# Patient Record
Sex: Female | Born: 1955 | Race: White | Hispanic: No | Marital: Single | Smoking: Current every day smoker
Health system: Southern US, Community
[De-identification: ages and names within clinical notes are randomized; demographics above are authoritative.]

## PROBLEM LIST (undated history)

## (undated) DIAGNOSIS — M1990 Unspecified osteoarthritis, unspecified site: Secondary | ICD-10-CM

## (undated) DIAGNOSIS — E559 Vitamin D deficiency, unspecified: Secondary | ICD-10-CM

## (undated) DIAGNOSIS — J449 Chronic obstructive pulmonary disease, unspecified: Secondary | ICD-10-CM

## (undated) DIAGNOSIS — I10 Essential (primary) hypertension: Secondary | ICD-10-CM

## (undated) DIAGNOSIS — C801 Malignant (primary) neoplasm, unspecified: Secondary | ICD-10-CM

## (undated) DIAGNOSIS — T7840XA Allergy, unspecified, initial encounter: Secondary | ICD-10-CM

## (undated) HISTORY — DX: Malignant (primary) neoplasm, unspecified: C80.1

## (undated) HISTORY — DX: Vitamin D deficiency, unspecified: E55.9

## (undated) HISTORY — DX: Unspecified osteoarthritis, unspecified site: M19.90

## (undated) HISTORY — PX: ABDOMINAL HYSTERECTOMY: SHX81

## (undated) HISTORY — DX: Allergy, unspecified, initial encounter: T78.40XA

---

## 1999-08-18 ENCOUNTER — Encounter: Payer: Self-pay | Admitting: Family Medicine

## 1999-08-18 ENCOUNTER — Ambulatory Visit (HOSPITAL_COMMUNITY): Admission: RE | Admit: 1999-08-18 | Discharge: 1999-08-18 | Payer: Self-pay | Admitting: Family Medicine

## 2000-09-21 ENCOUNTER — Emergency Department (HOSPITAL_COMMUNITY): Admission: EM | Admit: 2000-09-21 | Discharge: 2000-09-21 | Payer: Self-pay | Admitting: Emergency Medicine

## 2000-09-21 ENCOUNTER — Encounter: Payer: Self-pay | Admitting: Emergency Medicine

## 2000-10-24 ENCOUNTER — Ambulatory Visit (HOSPITAL_BASED_OUTPATIENT_CLINIC_OR_DEPARTMENT_OTHER): Admission: RE | Admit: 2000-10-24 | Discharge: 2000-10-24 | Payer: Self-pay

## 2000-11-07 ENCOUNTER — Encounter: Admission: RE | Admit: 2000-11-07 | Discharge: 2001-01-10 | Payer: Self-pay | Admitting: Family Medicine

## 2004-11-28 ENCOUNTER — Emergency Department (HOSPITAL_COMMUNITY): Admission: EM | Admit: 2004-11-28 | Discharge: 2004-11-28 | Payer: Self-pay | Admitting: Emergency Medicine

## 2004-12-09 ENCOUNTER — Emergency Department (HOSPITAL_COMMUNITY): Admission: EM | Admit: 2004-12-09 | Discharge: 2004-12-09 | Payer: Self-pay | Admitting: Family Medicine

## 2009-03-22 ENCOUNTER — Emergency Department (HOSPITAL_COMMUNITY): Admission: EM | Admit: 2009-03-22 | Discharge: 2009-03-22 | Payer: Self-pay | Admitting: Emergency Medicine

## 2009-03-25 ENCOUNTER — Emergency Department (HOSPITAL_COMMUNITY): Admission: EM | Admit: 2009-03-25 | Discharge: 2009-03-25 | Payer: Self-pay | Admitting: Emergency Medicine

## 2009-03-26 ENCOUNTER — Ambulatory Visit (HOSPITAL_COMMUNITY): Admission: RE | Admit: 2009-03-26 | Discharge: 2009-03-26 | Payer: Self-pay | Admitting: Emergency Medicine

## 2009-04-03 ENCOUNTER — Emergency Department (HOSPITAL_COMMUNITY): Admission: EM | Admit: 2009-04-03 | Discharge: 2009-04-03 | Payer: Self-pay | Admitting: Emergency Medicine

## 2009-08-05 ENCOUNTER — Encounter: Admission: RE | Admit: 2009-08-05 | Discharge: 2009-09-24 | Payer: Self-pay

## 2009-10-01 ENCOUNTER — Emergency Department (HOSPITAL_COMMUNITY): Admission: EM | Admit: 2009-10-01 | Discharge: 2009-10-01 | Payer: Self-pay | Admitting: Family Medicine

## 2010-03-02 ENCOUNTER — Emergency Department (HOSPITAL_COMMUNITY): Admission: EM | Admit: 2010-03-02 | Discharge: 2010-03-02 | Payer: Self-pay | Admitting: Family Medicine

## 2010-03-12 ENCOUNTER — Emergency Department (HOSPITAL_COMMUNITY): Admission: EM | Admit: 2010-03-12 | Discharge: 2010-03-12 | Payer: Self-pay | Admitting: Emergency Medicine

## 2010-09-04 ENCOUNTER — Emergency Department (HOSPITAL_COMMUNITY): Admission: EM | Admit: 2010-09-04 | Discharge: 2010-09-04 | Payer: Self-pay | Admitting: Family Medicine

## 2011-12-08 ENCOUNTER — Emergency Department (INDEPENDENT_AMBULATORY_CARE_PROVIDER_SITE_OTHER)
Admission: EM | Admit: 2011-12-08 | Discharge: 2011-12-08 | Disposition: A | Discharge status: Home / Self Care | Payer: Self-pay | Source: Home / Self Care | Attending: Family Medicine | Admitting: Family Medicine

## 2011-12-08 DIAGNOSIS — J40 Bronchitis, not specified as acute or chronic: Secondary | ICD-10-CM

## 2011-12-08 MED ORDER — GUAIFENESIN-CODEINE 100-10 MG/5ML PO SYRP: 5.0000 mL | ORAL_SOLUTION | Freq: Four times a day (QID) | ORAL | Status: AC | PRN

## 2011-12-08 MED ORDER — AZITHROMYCIN 250 MG PO TABS: 250.0000 mg | ORAL_TABLET | Freq: Every day | ORAL | Status: AC

## 2011-12-08 NOTE — ED Provider Notes (Signed)
History     CSN: [id number]  Arrival date & time 12/08/11  1225   First MD Initiated Contact with Patient 12/08/11 1358      Chief Complaint  Patient presents with  . Cough  . URI    (Consider location/radiation/quality/duration/timing/severity/associated sxs/prior treatment) HPI Comments: The patient reports having a productive cough that is causing her chest and abd to hurt. She states it started on the 23 of dec. Had fever initially that has resolved. Reports having a sore throat with some right ear discomfort. States she was improving but now symptoms seem to be worsening. She is taking otc meds with minimal relief.   Patient is a 56 y.o. female presenting with URI. The history is provided by the patient.  URI The primary symptoms include fever, ear pain, sore throat and cough.  Symptoms associated with the illness include chills, congestion and rhinorrhea.    History reviewed. No pertinent past medical history.  History reviewed. No pertinent past surgical history.  No family history on file.  History  Substance Use Topics  . Smoking status: Current Everyday Smoker -- 0.5 packs/day    Types: Cigarettes  . Smokeless tobacco: Not on file  . Alcohol Use: No    OB History    Grav Para Term Preterm Abortions TAB SAB Ect Mult Living                  Review of Systems  Constitutional: Positive for fever and chills.  HENT: Positive for ear pain, congestion, sore throat, rhinorrhea and postnasal drip. Negative for neck stiffness and ear discharge.   Respiratory: Positive for cough.   Cardiovascular: Negative.   Gastrointestinal: Negative.   Genitourinary: Negative.   Musculoskeletal: Negative.   Skin: Negative.     Allergies  Review of patient's allergies indicates no known allergies.  Home Medications   Current Outpatient Rx  Name Route Sig Dispense Refill  . AZITHROMYCIN 250 MG PO TABS Oral Take 1 tablet (250 mg total) by mouth daily. Take first 2 tablets  together, then 1 every day until finished. 6 tablet 0  . GUAIFENESIN-CODEINE 100-10 MG/5ML PO SYRP Oral Take 5 mLs by mouth every 6 (six) hours as needed for cough. 120 mL 0    BP 158/77  Pulse 80  Temp(Src) 98.4 F (36.9 C) (Oral)  Resp 18  SpO2 98%  Physical Exam  Nursing note and vitals reviewed. Constitutional: She appears well-developed and well-nourished. No distress.  HENT:  Head: Normocephalic.  Nose: Nose normal.  Mouth/Throat: Oropharynx is clear and moist.       Nasal congestion  Neck: Normal range of motion. Neck supple.  Cardiovascular: Normal rate and regular rhythm.   Pulmonary/Chest: Effort normal and breath sounds normal.       Congested cough  Lymphadenopathy:    She has no cervical adenopathy.  Skin: Skin is warm and dry.    ED Course  Procedures (including critical care time)  Labs Reviewed - No data to display No results found.   1. Bronchitis       MDM          Randa Spike, MD 12/08/11 [phone]

## 2011-12-08 NOTE — ED Notes (Signed)
C/o productive cough of yellow sputum, chills, sorethroat, rt ear pain, post nasal drainage and chest hurts with coughing.  Sx started 11/27/11 and are getting worse.

## 2014-04-18 ENCOUNTER — Other Ambulatory Visit (HOSPITAL_COMMUNITY): Payer: Self-pay

## 2014-04-18 DIAGNOSIS — R0602 Shortness of breath: Secondary | ICD-10-CM

## 2014-04-23 ENCOUNTER — Ambulatory Visit (HOSPITAL_COMMUNITY)
Admission: RE | Admit: 2014-04-23 | Discharge: 2014-04-23 | Disposition: A | Discharge status: Home / Self Care | Payer: Managed Care, Other (non HMO) | Source: Ambulatory Visit | Attending: Internal Medicine | Admitting: Internal Medicine

## 2014-04-23 DIAGNOSIS — R0602 Shortness of breath: Secondary | ICD-10-CM | POA: Insufficient documentation

## 2014-04-23 MED ORDER — ALBUTEROL SULFATE (2.5 MG/3ML) 0.083% IN NEBU
2.5000 mg | INHALATION_SOLUTION | Freq: Once | RESPIRATORY_TRACT | Status: AC
  Administered 2014-04-23: 2.5 mg via RESPIRATORY_TRACT

## 2014-04-24 LAB — PULMONARY FUNCTION TEST
DL/VA % pred: 116 %
DL/VA: 5.31 ml/min/mmHg/L
DLCO unc % pred: 86 %
DLCO unc: 18.57 ml/min/mmHg
FEF 25-75 PRE: 0.72 L/s
FEF 25-75 Post: 0.69 L/sec
FEF2575-%CHANGE-POST: -3 %
FEF2575-%Pred-Post: 30 %
FEF2575-%Pred-Pre: 31 %
FEV1-%CHANGE-POST: -1 %
FEV1-%PRED-POST: 56 %
FEV1-%PRED-PRE: 57 %
FEV1-Post: 1.36 L
FEV1-Pre: 1.39 L
FEV1FVC-%CHANGE-POST: -5 %
FEV1FVC-%Pred-Pre: 79 %
FEV6-%Change-Post: 0 %
FEV6-%PRED-POST: 74 %
FEV6-%Pred-Pre: 73 %
FEV6-PRE: 2.19 L
FEV6-Post: 2.21 L
FEV6FVC-%Change-Post: 0 %
FEV6FVC-%PRED-PRE: 102 %
FEV6FVC-%Pred-Post: 102 %
FVC-%Change-Post: 3 %
FVC-%Pred-Post: 74 %
FVC-%Pred-Pre: 72 %
FVC-POST: 2.31 L
FVC-Pre: 2.23 L
POST FEV1/FVC RATIO: 59 %
POST FEV6/FVC RATIO: 99 %
PRE FEV6/FVC RATIO: 99 %
Pre FEV1/FVC ratio: 62 %
RV % PRED: 106 %
RV: 1.99 L
TLC % PRED: 91 %
TLC: 4.35 L

## 2015-09-02 ENCOUNTER — Encounter: Payer: Self-pay | Admitting: Gastroenterology

## 2015-09-29 ENCOUNTER — Encounter: Payer: Managed Care, Other (non HMO) | Admitting: Gastroenterology

## 2015-10-19 ENCOUNTER — Other Ambulatory Visit: Payer: Self-pay | Admitting: Family Medicine

## 2015-10-19 ENCOUNTER — Ambulatory Visit
Admission: RE | Admit: 2015-10-19 | Discharge: 2015-10-19 | Disposition: A | Discharge status: Home / Self Care | Payer: No Typology Code available for payment source | Source: Ambulatory Visit | Attending: Family Medicine | Admitting: Family Medicine

## 2015-10-19 DIAGNOSIS — IMO0001 Reserved for inherently not codable concepts without codable children: Secondary | ICD-10-CM

## 2015-11-10 ENCOUNTER — Encounter: Payer: Self-pay | Admitting: Internal Medicine

## 2017-07-05 ENCOUNTER — Encounter (HOSPITAL_COMMUNITY): Payer: Self-pay | Admitting: Emergency Medicine

## 2017-07-05 ENCOUNTER — Ambulatory Visit (HOSPITAL_COMMUNITY)
Admission: EM | Admit: 2017-07-05 | Discharge: 2017-07-05 | Disposition: A | Discharge status: Home / Self Care | Payer: No Typology Code available for payment source | Attending: Family Medicine | Admitting: Family Medicine

## 2017-07-05 DIAGNOSIS — R05 Cough: Secondary | ICD-10-CM

## 2017-07-05 DIAGNOSIS — R0602 Shortness of breath: Secondary | ICD-10-CM

## 2017-07-05 DIAGNOSIS — J441 Chronic obstructive pulmonary disease with (acute) exacerbation: Secondary | ICD-10-CM

## 2017-07-05 HISTORY — DX: Essential (primary) hypertension: I10

## 2017-07-05 HISTORY — DX: Chronic obstructive pulmonary disease, unspecified: J44.9

## 2017-07-05 MED ORDER — DOXYCYCLINE HYCLATE 100 MG PO TABS: 100.0000 mg | ORAL_TABLET | Freq: Two times a day (BID) | ORAL | 0 refills | Status: DC

## 2017-07-05 MED ORDER — PREDNISONE 50 MG PO TABS: 50.0000 mg | ORAL_TABLET | Freq: Every day | ORAL | 0 refills | Status: DC

## 2017-07-05 NOTE — Discharge Instructions (Signed)
Please take doxycycline and prednisone as prescribed. Please follow-up with your PCP in the next week to ensure you are having resolution of her cough and to consider next steps. Her blood pressure. Please make sure to take your losartan today when you go home, I would take 2 pills tonight.

## 2017-07-05 NOTE — ED Triage Notes (Signed)
The patient presented to the [facility] with a complaint of cough x 2 weeks. The patient also complained of bilateral ear congestion.

## 2017-07-05 NOTE — ED Provider Notes (Signed)
CSN: [id number]     Arrival date & time 07/05/17  1523 History   None    Chief Complaint  Patient presents with  . Cough   (Consider location/radiation/quality/duration/timing/severity/associated sxs/prior Treatment) HPI   Patient with hx of COPD. Patietnt has been SOB. Worsening cough over two weeks.  Patient has been coughing up thick yellow sputum. Has felt a bit warm, though has not checked for fevers. States she has had nasal and ear congestion. No hx of sore throat. Patient states she has had previously high blood pressure. Last week had some headache, took ibuprofen and this resolved.   Past Medical History:  Diagnosis Date  . COPD (chronic obstructive pulmonary disease) ([place])   . Hypertension    History reviewed. No pertinent surgical history. History reviewed. No pertinent family history. Social History  Substance Use Topics  . Smoking status: Current Every Day Smoker    Packs/day: 0.50    Types: Cigarettes  . Smokeless tobacco: Never Used  . Alcohol use No   OB History    No data available     Review of Systems  Constitutional: Negative for chills and fever.  HENT: Positive for congestion. Negative for sore throat.   Eyes: Negative for pain and discharge.  Respiratory: Positive for cough. Negative for shortness of breath and wheezing.   Gastrointestinal: Negative for nausea and vomiting.  Skin: Negative for rash.  Neurological: Negative for dizziness, weakness and headaches.    Allergies  Patient has no known allergies.  Home Medications   Prior to Admission medications   Medication Sig Start Date End Date Taking? Authorizing Provider  losartan (COZAAR) 25 MG tablet Take 25 mg by mouth daily.   Yes [provider]  doxycycline (VIBRA-TABS) 100 MG tablet Take 1 tablet (100 mg total) by mouth 2 (two) times daily. 07/05/17   Taccara Bushnell, Jeani Sow, MD  predniSONE (DELTASONE) 50 MG tablet Take 1 tablet (50 mg total) by mouth daily with breakfast. 07/05/17    Arend Bahl, Jeani Sow, MD   Meds Ordered and Administered this Visit  Medications - No data to display  BP (!) 209/117 (BP Location: Right Arm) Comment: 2 times. Has not taken HTN medication today  Pulse (!) 57   Temp 98.6 F (37 C) (Oral)   Resp 18   SpO2 97%  No data found.   Physical Exam  Constitutional: She is oriented to person, place, and time. She appears well-developed and well-nourished.  HENT:  Head: Normocephalic and atraumatic.  Right Ear: External ear normal.  Left Ear: External ear normal.  Noted to have some post-nasal drip   Eyes: Pupils are equal, round, and reactive to light. Conjunctivae are normal.  Neck: Neck supple.  Cardiovascular: Normal rate.   Pulmonary/Chest: Effort normal. No respiratory distress. She has no wheezes.  Abdominal: Soft. Bowel sounds are normal.  Musculoskeletal: Normal range of motion.  Neurological: She is alert and oriented to person, place, and time.  Skin: Skin is warm and dry.  Psychiatric: She has a normal mood and affect.    Urgent Care Course     Procedures (including critical care time)  Labs Review Labs Reviewed - No data to display  Imaging Review No results found.      MDM   1. COPD exacerbation (HCC)    History of COPD with 2 weeks of cough, congestion, increased sputum production, dyspnea Will treat for COPD exacerbation noted some postnasal drip otherwise overall normal physical exam. Nares have elevated blood pressure  during this visit has not taken her blood pressure medication today-no headache, blurry vision. Patient plans to take blood pressure medication when she goes home. Will follow-up in one week with PCP  Meds ordered this encounter  Medications  . losartan (COZAAR) 25 MG tablet    Sig: Take 25 mg by mouth daily.  Marland Kitchen doxycycline (VIBRA-TABS) 100 MG tablet    Sig: Take 1 tablet (100 mg total) by mouth 2 (two) times daily.    Dispense:  14 tablet    Refill:  0  . predniSONE (DELTASONE) 50  MG tablet    Sig: Take 1 tablet (50 mg total) by mouth daily with breakfast.    Dispense:  5 tablet    Refill:  0       Tonette Bihari, MD 07/05/17 [phone]

## 2017-07-14 ENCOUNTER — Other Ambulatory Visit: Payer: Self-pay

## 2017-07-14 ENCOUNTER — Emergency Department (HOSPITAL_COMMUNITY): Payer: Self-pay

## 2017-07-14 ENCOUNTER — Encounter (HOSPITAL_COMMUNITY): Payer: Self-pay | Admitting: *Deleted

## 2017-07-14 ENCOUNTER — Emergency Department (HOSPITAL_COMMUNITY)
Admission: EM | Admit: 2017-07-14 | Discharge: 2017-07-14 | Disposition: A | Discharge status: Home / Self Care | Payer: Self-pay | Attending: Emergency Medicine | Admitting: Emergency Medicine

## 2017-07-14 DIAGNOSIS — J449 Chronic obstructive pulmonary disease, unspecified: Secondary | ICD-10-CM | POA: Insufficient documentation

## 2017-07-14 DIAGNOSIS — I10 Essential (primary) hypertension: Secondary | ICD-10-CM | POA: Insufficient documentation

## 2017-07-14 DIAGNOSIS — F17210 Nicotine dependence, cigarettes, uncomplicated: Secondary | ICD-10-CM | POA: Insufficient documentation

## 2017-07-14 DIAGNOSIS — R410 Disorientation, unspecified: Secondary | ICD-10-CM | POA: Insufficient documentation

## 2017-07-14 LAB — CBG MONITORING, ED: GLUCOSE-CAPILLARY: 90 mg/dL (ref 65–99)

## 2017-07-14 LAB — RAPID URINE DRUG SCREEN, HOSP PERFORMED
Amphetamines: POSITIVE — AB
Barbiturates: NOT DETECTED
Benzodiazepines: NOT DETECTED
Cocaine: NOT DETECTED
OPIATES: NOT DETECTED
Tetrahydrocannabinol: NOT DETECTED

## 2017-07-14 LAB — COMPREHENSIVE METABOLIC PANEL
ALBUMIN: 4.1 g/dL (ref 3.5–5.0)
ALT: 20 U/L (ref 14–54)
AST: 27 U/L (ref 15–41)
Alkaline Phosphatase: 64 U/L (ref 38–126)
Anion gap: 8 (ref 5–15)
BUN: 15 mg/dL (ref 6–20)
CALCIUM: 9.9 mg/dL (ref 8.9–10.3)
CO2: 24 mmol/L (ref 22–32)
CREATININE: 0.68 mg/dL (ref 0.44–1.00)
Chloride: 108 mmol/L (ref 101–111)
Glucose, Bld: 125 mg/dL — ABNORMAL HIGH (ref 65–99)
Potassium: 4.8 mmol/L (ref 3.5–5.1)
SODIUM: 140 mmol/L (ref 135–145)
Total Bilirubin: 0.6 mg/dL (ref 0.3–1.2)
Total Protein: 7.1 g/dL (ref 6.5–8.1)

## 2017-07-14 LAB — URINALYSIS, ROUTINE W REFLEX MICROSCOPIC
Bilirubin Urine: NEGATIVE
Glucose, UA: NEGATIVE mg/dL
Hgb urine dipstick: NEGATIVE
KETONES UR: NEGATIVE mg/dL
LEUKOCYTES UA: NEGATIVE
NITRITE: NEGATIVE
PROTEIN: NEGATIVE mg/dL
RBC / HPF: NONE SEEN RBC/hpf (ref 0–5)
Specific Gravity, Urine: 1.014 (ref 1.005–1.030)
pH: 8 (ref 5.0–8.0)

## 2017-07-14 LAB — CBC
HCT: 45.9 % (ref 36.0–46.0)
Hemoglobin: 14.9 g/dL (ref 12.0–15.0)
MCH: 27.7 pg (ref 26.0–34.0)
MCHC: 32.5 g/dL (ref 30.0–36.0)
MCV: 85.3 fL (ref 78.0–100.0)
PLATELETS: 328 10*3/uL (ref 150–400)
RBC: 5.38 MIL/uL — ABNORMAL HIGH (ref 3.87–5.11)
RDW: 13.8 % (ref 11.5–15.5)
WBC: 6.3 10*3/uL (ref 4.0–10.5)

## 2017-07-14 LAB — TSH: TSH: 1.014 u[IU]/mL (ref 0.350–4.500)

## 2017-07-14 NOTE — ED Notes (Signed)
Pt departed in NAD, refused use of wheelchair.  

## 2017-07-14 NOTE — ED Provider Notes (Signed)
[facility] DEPT Provider Note   CSN: [id number] Arrival date & time: 07/14/17  1503     History   Chief Complaint Chief Complaint  Patient presents with  . Altered Mental Status    HPI Maria Chase is a 61 y.o. female. With history of COPD, HTN,  Who presents with altered mental status. Husband and daughter at bedside, providing history. They report last known normal was 11 PM. She awoke in the middle the night thinking someone was coming in the door, however no one was there. This morning after showering, she developed diffuse body shaking, vomiting, and seemed to be confused. Patient thought she saw snails in her vomit. She has made comments regarding snails in her intestines for the past month. Her confusion seems new to her family members.  She was recently treated for COPD exacerbation with antibiotics and prednisone, which she has completed. She is still had some lingering cough, nonproductive. She denies any recent fevers, chills, chest pain or shortness of breath, abdominal pain, or urinary symptoms. She denies any recent falls or trauma. She denies any recent ingestions.  HPI  Past Medical History:  Diagnosis Date  . COPD (chronic obstructive pulmonary disease) ([place])   . Hypertension     There are no active problems to display for this patient.   History reviewed. No pertinent surgical history.  OB History    No data available       Home Medications    Prior to Admission medications   Medication Sig Start Date End Date Taking? Authorizing Provider  albuterol (PROVENTIL) (5 MG/ML) 0.5% nebulizer solution Take 0.5 mLs by nebulization as needed for wheezing.   Yes [provider]  alendronate (FOSAMAX) 70 MG tablet Take 70 mg by mouth once a week. 06/20/17  Yes [provider]  ibuprofen (ADVIL,MOTRIN) 200 MG tablet Take 800 mg by mouth every 6 (six) hours as needed for mild pain.   Yes [provider]  losartan (COZAAR) 25 MG tablet  Take 25 mg by mouth daily.   Yes [provider]  Vitamin D, Ergocalciferol, (DRISDOL) 50000 units CAPS capsule Take 50,000 Units by mouth once a week. 05/19/17  Yes [provider]  doxycycline (VIBRA-TABS) 100 MG tablet Take 1 tablet (100 mg total) by mouth 2 (two) times daily. Patient not taking: Reported on 07/14/2017 07/05/17   Tonette Bihari, MD  predniSONE (DELTASONE) 50 MG tablet Take 1 tablet (50 mg total) by mouth daily with breakfast. Patient not taking: Reported on 07/14/2017 07/05/17   Tonette Bihari, MD    Family History No family history on file.  Social History Social History  Substance Use Topics  . Smoking status: Current Every Day Smoker    Packs/day: 0.50    Types: Cigarettes  . Smokeless tobacco: Never Used  . Alcohol use No     Allergies   Patient has no known allergies.   Review of Systems Review of Systems  Constitutional: Negative for chills and fever.  HENT: Negative for ear pain and sore throat.   Eyes: Negative for pain and visual disturbance.  Respiratory: Positive for cough. Negative for shortness of breath.   Cardiovascular: Negative for chest pain and palpitations.  Gastrointestinal: Positive for vomiting. Negative for abdominal pain.  Genitourinary: Negative for dysuria and hematuria.  Musculoskeletal: Negative for arthralgias and back pain.  Skin: Negative for color change and rash.  Neurological: Negative for seizures and syncope.  Psychiatric/Behavioral: Positive for confusion.  All other systems  reviewed and are negative.    Physical Exam Updated Vital Signs BP (!) 172/96   Pulse (!) 121   Temp 98 F (36.7 C) (Oral)   Resp (!) 24   Ht 5\' 3"  (1.6 m)   Wt 61.2 kg (135 lb)   SpO2 96%   BMI 23.91 kg/m   Physical Exam  Constitutional: She appears well-developed and well-nourished. No distress.  HENT:  Head: Normocephalic and atraumatic.  Eyes: Conjunctivae are normal.  Neck: Neck supple.    Cardiovascular: Normal rate and regular rhythm.   No murmur heard. Pulmonary/Chest: Effort normal and breath sounds normal. No respiratory distress.  Abdominal: Soft. There is no tenderness.  Musculoskeletal: She exhibits no edema.  Neurological: She is alert.  Skin: Skin is warm and dry.  Psychiatric: She has a normal mood and affect.  Nursing note and vitals reviewed.    ED Treatments / Results  Labs (all labs ordered are listed, but only abnormal results are displayed) Labs Reviewed  COMPREHENSIVE METABOLIC PANEL - Abnormal; Notable for the following:       Result Value   Glucose, Bld 125 (*)    All other components within normal limits  CBC - Abnormal; Notable for the following:    RBC 5.38 (*)    All other components within normal limits  URINALYSIS, ROUTINE W REFLEX MICROSCOPIC - Abnormal; Notable for the following:    APPearance TURBID (*)    Bacteria, UA RARE (*)    Squamous Epithelial / LPF 0-5 (*)    All other components within normal limits  RAPID URINE DRUG SCREEN, HOSP PERFORMED - Abnormal; Notable for the following:    Amphetamines POSITIVE (*)    All other components within normal limits  TSH  CBG MONITORING, ED    EKG  EKG Interpretation  Date/Time:  Friday July 14 2017 15:40:13 EDT Ventricular Rate:  97 PR Interval:  148 QRS Duration: 128 QT Interval:  384 QTC Calculation: 487 R Axis:   75 Text Interpretation:  Normal sinus rhythm Biatrial enlargement Right bundle branch block Confirmed by Lajean Saver [phone]) on 07/14/2017 5:44:44 PM       Radiology Dg Chest 2 View  Result Date: 07/14/2017 CLINICAL DATA:  Productive cough with mucus for 2 weeks off and on, history COPD, hypertension, smoking EXAM: CHEST  2 VIEW COMPARISON:  10/19/2015 FINDINGS: Mild enlargement of cardiac silhouette. Mediastinal contours and pulmonary vascularity normal. Emphysematous and bronchitic changes consistent with COPD. No acute infiltrate, pleural effusion, or  pneumothorax. Bones demineralized. IMPRESSION: COPD changes without acute infiltrate. Mild enlargement of cardiac silhouette. Electronically Signed   By: Lavonia Dana M.D.   On: 07/14/2017 18:24   Ct Head Wo Contrast  Result Date: 07/14/2017 CLINICAL DATA:  Altered mental status and headache EXAM: CT HEAD WITHOUT CONTRAST TECHNIQUE: Contiguous axial images were obtained from the base of the skull through the vertex without intravenous contrast. COMPARISON:  None FINDINGS: Brain: No mass lesion, intraparenchymal hemorrhage or extra-axial collection. No evidence of acute cortical infarct. There is periventricular hypoattenuation compatible with chronic microvascular disease. Vascular: No hyperdense vessel or unexpected calcification. Skull: Normal visualized skull base, calvarium and extracranial soft tissues. Sinuses/Orbits: No sinus fluid levels or advanced mucosal thickening. No mastoid effusion. Normal orbits. IMPRESSION: Chronic microvascular ischemia without acute abnormality. Electronically Signed   By: Ulyses Jarred M.D.   On: 07/14/2017 17:22    Procedures Procedures (including critical care time)  Medications Ordered in ED Medications - No data to display  Initial Impression / Assessment and Plan / ED Course  I have reviewed the triage vital signs and the nursing notes.  Pertinent labs & imaging results that were available during my care of the patient were reviewed by me and considered in my medical decision making (see chart for details).     Patient is a 61 year old female who presents with confusion and thoughts perseverating over abdominal infestation with snails. Patient arrived hypertensive, although had not taken her blood pressure medication today. She was otherwise hemodynamically stable. Tearful on exam. She was redirectable on exam, appropriate. Exam otherwise as above.   Labs and imaging obtained, no acute abnormalities. Positive for amphetamines on UDS, however patient  denies any drug usage. Given month long symptoms and history of some anxiety/family stressors, there may be a psychiatric component to patient's symptoms today. Provided reassurance on labs and negative head CT. I discussed close follow-up with her primary care physician. Resource guide in the area for outpatient psychiatric follow-up provided. Husband feels safe taking patient home. Patient and her husband in agreement with plan at time of discharge.  Patient and plan of care discussed with Attending physician, Dr. Ashok Cordia.    Final Clinical Impressions(s) / ED Diagnoses   Final diagnoses:  Confusion    New Prescriptions Discharge Medication List as of 07/14/2017  7:28 PM       Arnetha Massy, MD 07/15/17 Leandrew Koyanagi    Lajean Saver, MD 07/15/17 1513

## 2017-07-14 NOTE — ED Notes (Signed)
Returned from xray

## 2017-07-14 NOTE — Discharge Instructions (Signed)
Your work up was reassuring at this time. Please follow up with PCM in next week. Return to ED with any new or concerning symptoms.

## 2017-07-14 NOTE — ED Triage Notes (Signed)
PT had some cough and coughed it up and she was convinced there were snails in it, but nothing in it.  Pt was given abx and taking it. Family states patient has been having confusion.  She thinks she has snails in her stomach and ears.  Pt went to take a shower and felt her stomach swelled up and the snails were in there and she could not catch her breath.  Pt states she was vomiting and seeing snails in there.  Husband could not understand her. They state this happens intermittently.

## 2017-07-14 NOTE — ED Notes (Signed)
Pt transported to XR.  

## 2017-07-14 NOTE — ED Notes (Signed)
Patient transported to CT 

## 2018-11-30 IMAGING — CT CT HEAD W/O CM
4 series · 16 of 47 positions shown, 18 images · non-contrast
Comparison: None

CLINICAL DATA: Altered mental status and headache

EXAM:
CT HEAD WITHOUT CONTRAST
TECHNIQUE: Contiguous axial images were obtained from the base of the skull
through the vertex without intravenous contrast.

[Series 3: head wo · axial · 0.41mm/px · z∈[-138,-22]mm · 6 of 33 slices shown, 8 images]
[im 5/33  brain]
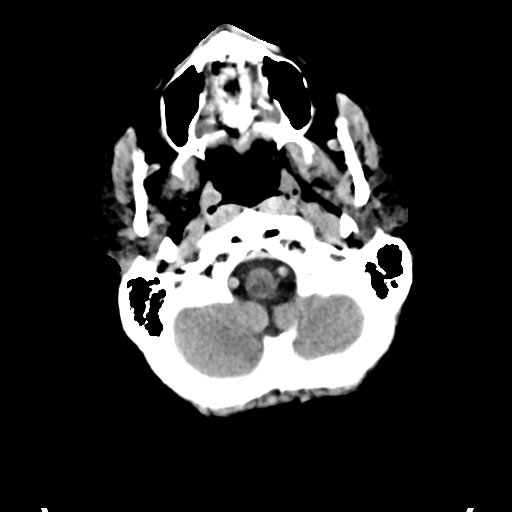
[im 5/33  bone]
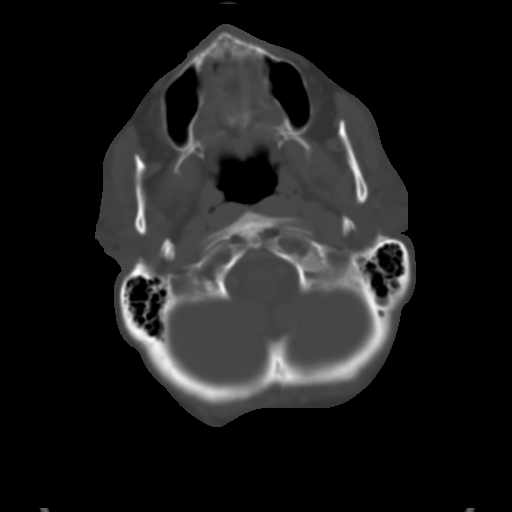
[im 10/33  brain]
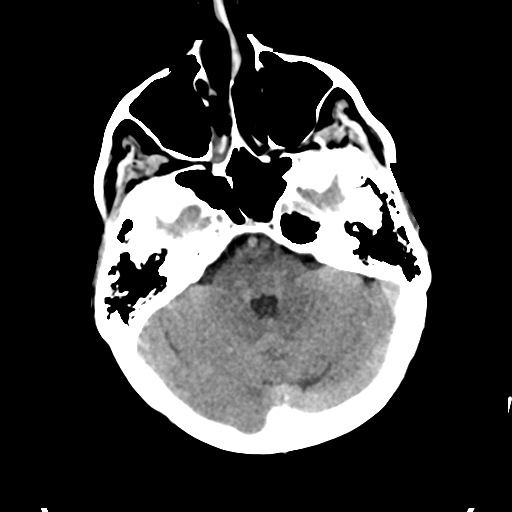
[im 14/33  brain]
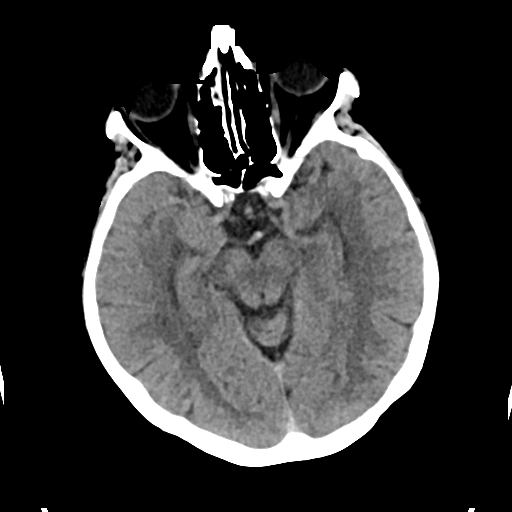
[im 19/33  brain]
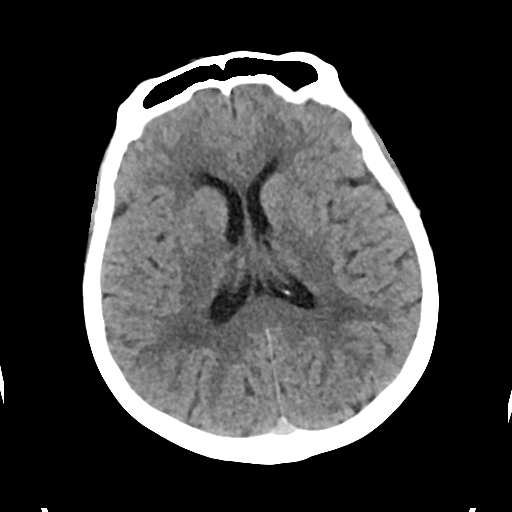
[im 23/33  brain]
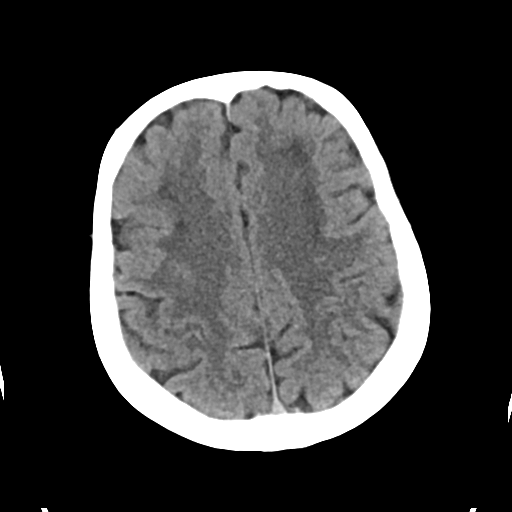
[im 23/33  bone]
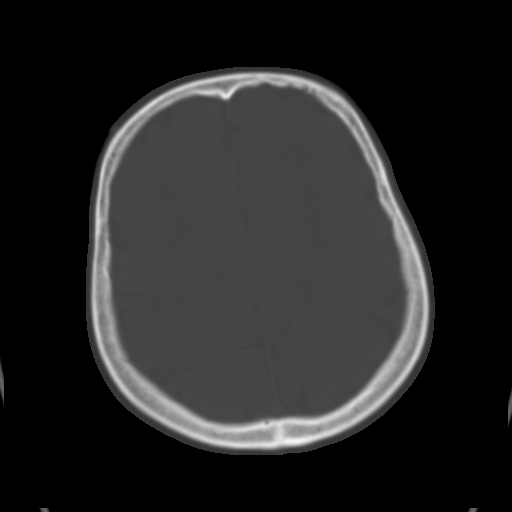
[im 28/33  brain]
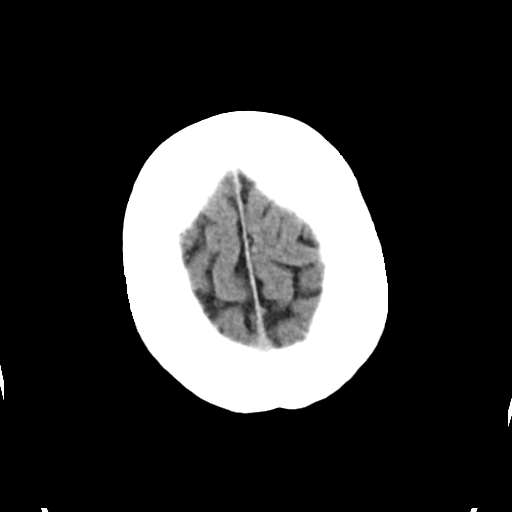

[Series 4: head bone · axial · 0.41mm/px · z∈[-142,-86]mm · 4 of 85 slices shown]
[im 9/85  bone]
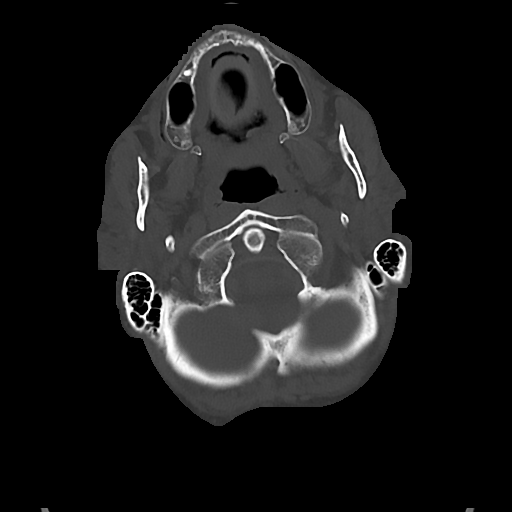
[im 17/85  bone]
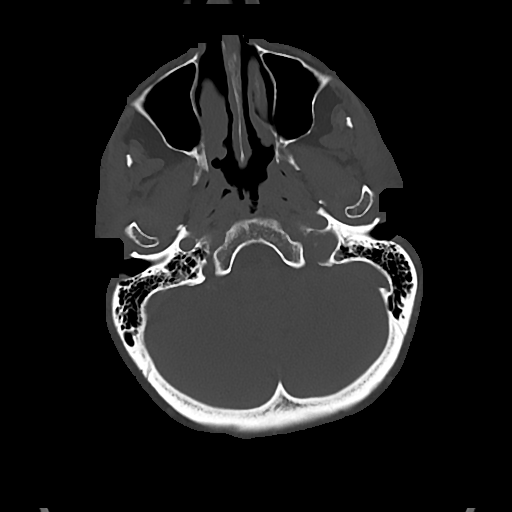
[im 29/85  bone]
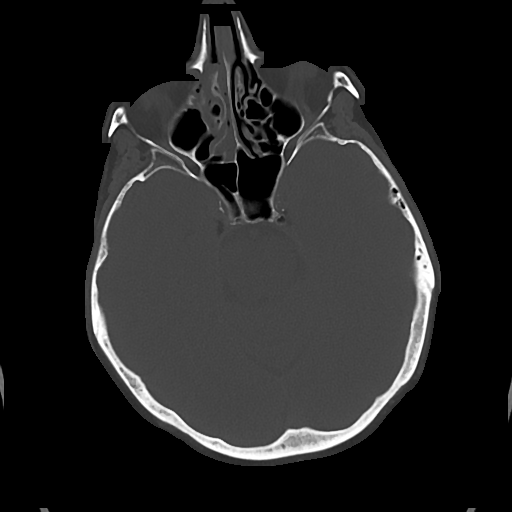
[im 37/85  bone]
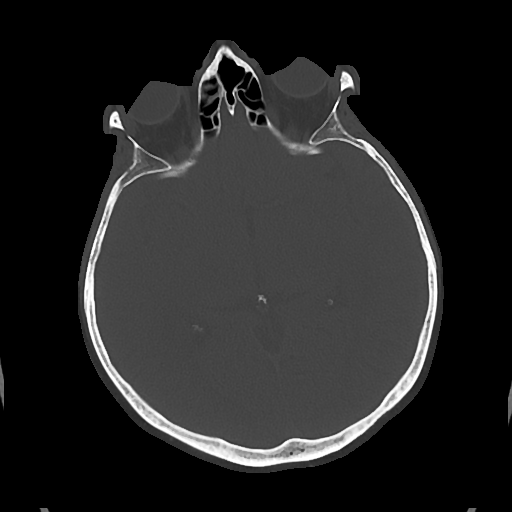

[Series 5: cor soft · coronal · 0.33mm/px · 3 of 72 slices shown]
[im 24/72  brain]
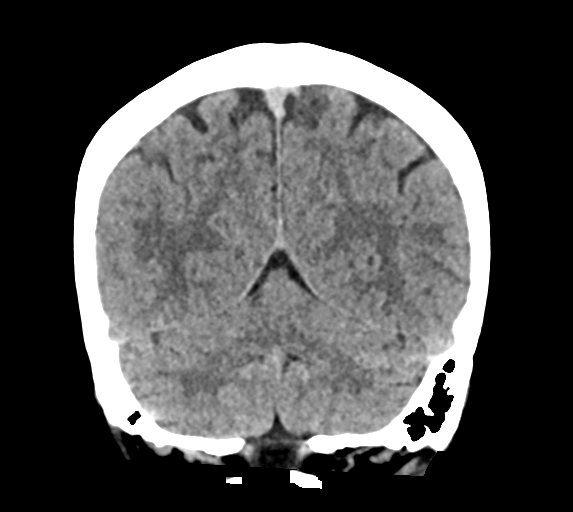
[im 32/72  brain]
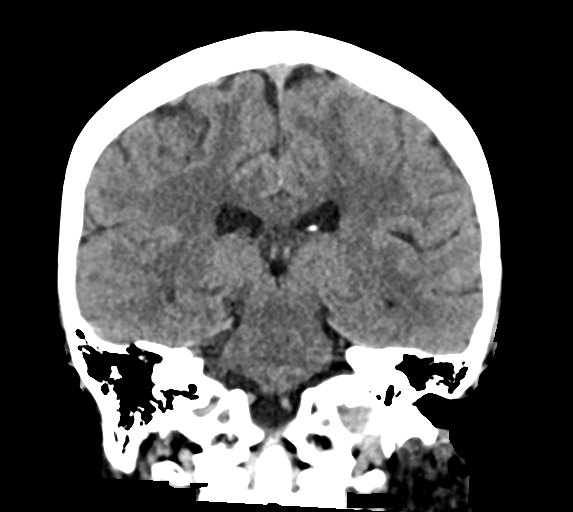
[im 40/72  brain]
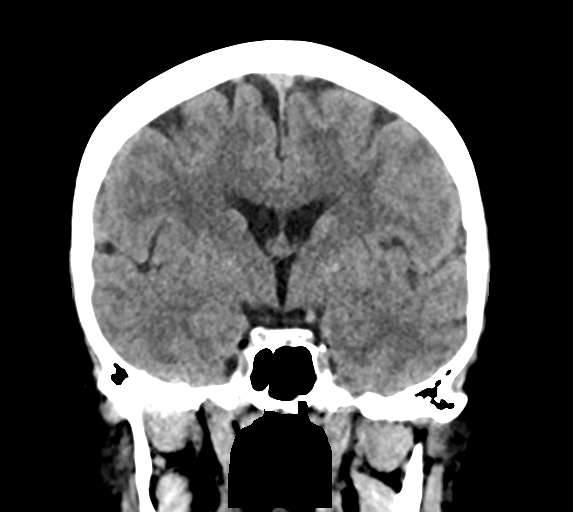

[Series 6: sag soft · sagittal · 0.33mm/px · 3 of 67 slices shown]
[im 24/67  brain]
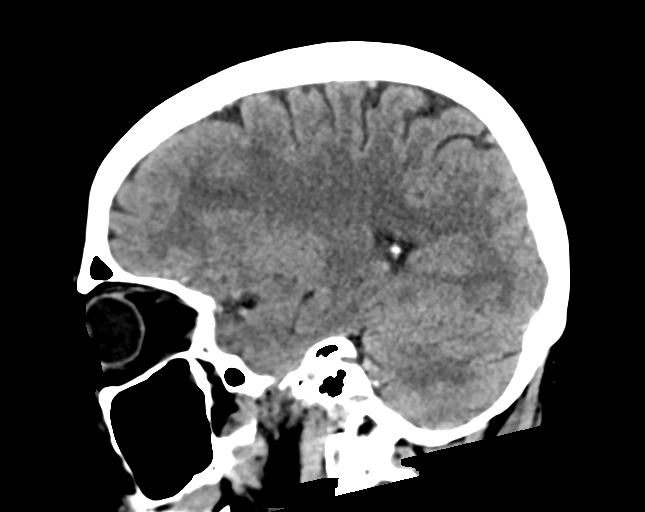
[im 34/67  brain]
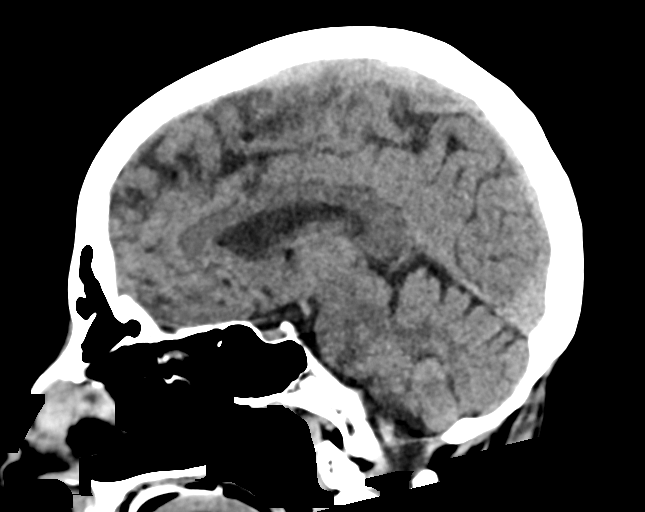
[im 43/67  brain]
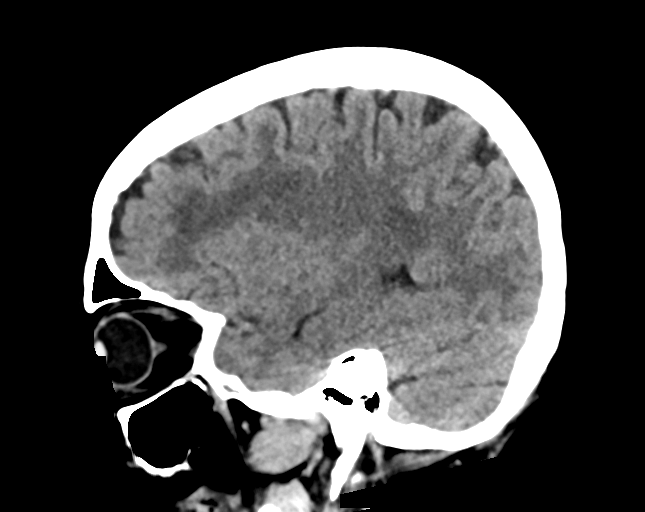

[16 of 47 positions shown; findings below may reference images not displayed]

FINDINGS: Brain: No mass lesion, intraparenchymal hemorrhage or extra-axial
collection. No evidence of acute cortical infarct. There is
periventricular hypoattenuation compatible with chronic
microvascular disease.

Vascular: No hyperdense vessel or unexpected calcification.

Skull: Normal visualized skull base, calvarium and extracranial soft
tissues.

Sinuses/Orbits: No sinus fluid levels or advanced mucosal
thickening. No mastoid effusion. Normal orbits.
IMPRESSION: Chronic microvascular ischemia without acute abnormality.

## 2020-02-07 DIAGNOSIS — M810 Age-related osteoporosis without current pathological fracture: Secondary | ICD-10-CM | POA: Diagnosis not present

## 2020-02-07 DIAGNOSIS — E7849 Other hyperlipidemia: Secondary | ICD-10-CM | POA: Diagnosis not present

## 2020-02-07 DIAGNOSIS — R739 Hyperglycemia, unspecified: Secondary | ICD-10-CM | POA: Diagnosis not present

## 2020-02-14 DIAGNOSIS — J441 Chronic obstructive pulmonary disease with (acute) exacerbation: Secondary | ICD-10-CM | POA: Diagnosis not present

## 2020-02-14 DIAGNOSIS — E785 Hyperlipidemia, unspecified: Secondary | ICD-10-CM | POA: Diagnosis not present

## 2020-02-14 DIAGNOSIS — I10 Essential (primary) hypertension: Secondary | ICD-10-CM | POA: Diagnosis not present

## 2020-02-14 DIAGNOSIS — E559 Vitamin D deficiency, unspecified: Secondary | ICD-10-CM | POA: Diagnosis not present

## 2020-02-14 DIAGNOSIS — F17210 Nicotine dependence, cigarettes, uncomplicated: Secondary | ICD-10-CM | POA: Diagnosis not present

## 2020-02-14 DIAGNOSIS — Z0000 Encounter for general adult medical examination without abnormal findings: Secondary | ICD-10-CM | POA: Diagnosis not present

## 2020-02-14 DIAGNOSIS — R000 Tachycardia, unspecified: Secondary | ICD-10-CM | POA: Diagnosis not present

## 2020-02-14 DIAGNOSIS — F17200 Nicotine dependence, unspecified, uncomplicated: Secondary | ICD-10-CM | POA: Diagnosis not present

## 2020-02-14 DIAGNOSIS — R739 Hyperglycemia, unspecified: Secondary | ICD-10-CM | POA: Diagnosis not present

## 2020-02-14 DIAGNOSIS — J449 Chronic obstructive pulmonary disease, unspecified: Secondary | ICD-10-CM | POA: Diagnosis not present

## 2020-02-14 DIAGNOSIS — I4510 Unspecified right bundle-branch block: Secondary | ICD-10-CM | POA: Diagnosis not present

## 2020-02-14 DIAGNOSIS — M810 Age-related osteoporosis without current pathological fracture: Secondary | ICD-10-CM | POA: Diagnosis not present

## 2020-09-30 DIAGNOSIS — F17210 Nicotine dependence, cigarettes, uncomplicated: Secondary | ICD-10-CM | POA: Diagnosis not present

## 2020-09-30 DIAGNOSIS — J441 Chronic obstructive pulmonary disease with (acute) exacerbation: Secondary | ICD-10-CM | POA: Diagnosis not present

## 2020-09-30 DIAGNOSIS — J309 Allergic rhinitis, unspecified: Secondary | ICD-10-CM | POA: Diagnosis not present

## 2020-09-30 DIAGNOSIS — J9801 Acute bronchospasm: Secondary | ICD-10-CM | POA: Diagnosis not present

## 2020-09-30 DIAGNOSIS — R9389 Abnormal findings on diagnostic imaging of other specified body structures: Secondary | ICD-10-CM | POA: Diagnosis not present

## 2020-10-15 DIAGNOSIS — R059 Cough, unspecified: Secondary | ICD-10-CM | POA: Diagnosis not present

## 2020-11-10 DIAGNOSIS — R059 Cough, unspecified: Secondary | ICD-10-CM | POA: Diagnosis not present

## 2020-12-15 DIAGNOSIS — J449 Chronic obstructive pulmonary disease, unspecified: Secondary | ICD-10-CM | POA: Diagnosis not present

## 2020-12-15 DIAGNOSIS — J441 Chronic obstructive pulmonary disease with (acute) exacerbation: Secondary | ICD-10-CM | POA: Diagnosis not present

## 2021-01-12 DIAGNOSIS — Z79899 Other long term (current) drug therapy: Secondary | ICD-10-CM | POA: Diagnosis not present

## 2021-01-15 DIAGNOSIS — J441 Chronic obstructive pulmonary disease with (acute) exacerbation: Secondary | ICD-10-CM | POA: Diagnosis not present

## 2021-02-01 DIAGNOSIS — Z1211 Encounter for screening for malignant neoplasm of colon: Secondary | ICD-10-CM | POA: Diagnosis not present

## 2021-02-01 DIAGNOSIS — Z23 Encounter for immunization: Secondary | ICD-10-CM | POA: Diagnosis not present

## 2021-02-01 DIAGNOSIS — Z0000 Encounter for general adult medical examination without abnormal findings: Secondary | ICD-10-CM | POA: Diagnosis not present

## 2021-02-01 DIAGNOSIS — Z283 Underimmunization status: Secondary | ICD-10-CM | POA: Diagnosis not present

## 2021-02-03 DIAGNOSIS — J441 Chronic obstructive pulmonary disease with (acute) exacerbation: Secondary | ICD-10-CM | POA: Diagnosis not present

## 2021-02-03 DIAGNOSIS — R0600 Dyspnea, unspecified: Secondary | ICD-10-CM | POA: Diagnosis not present

## 2021-02-12 DIAGNOSIS — J441 Chronic obstructive pulmonary disease with (acute) exacerbation: Secondary | ICD-10-CM | POA: Diagnosis not present

## 2021-03-15 ENCOUNTER — Other Ambulatory Visit: Payer: Self-pay

## 2021-03-15 ENCOUNTER — Ambulatory Visit (AMBULATORY_SURGERY_CENTER): Payer: Self-pay | Admitting: *Deleted

## 2021-03-15 VITALS — Ht 63.0 in | Wt 126.8 lb

## 2021-03-15 DIAGNOSIS — J441 Chronic obstructive pulmonary disease with (acute) exacerbation: Secondary | ICD-10-CM | POA: Diagnosis not present

## 2021-03-15 DIAGNOSIS — Z1211 Encounter for screening for malignant neoplasm of colon: Secondary | ICD-10-CM

## 2021-03-15 NOTE — Progress Notes (Signed)

## 2021-03-23 ENCOUNTER — Other Ambulatory Visit: Payer: Self-pay

## 2021-03-23 ENCOUNTER — Encounter: Payer: Self-pay | Admitting: Internal Medicine

## 2021-03-23 ENCOUNTER — Ambulatory Visit (AMBULATORY_SURGERY_CENTER): Payer: PPO | Admitting: Internal Medicine

## 2021-03-23 VITALS — BP 113/77 | HR 107 | Temp 97.1°F | Resp 23 | Ht 63.0 in | Wt 126.0 lb

## 2021-03-23 DIAGNOSIS — Z1211 Encounter for screening for malignant neoplasm of colon: Secondary | ICD-10-CM

## 2021-03-23 MED ORDER — SODIUM CHLORIDE 0.9 % IV SOLN: 500.0000 mL | Freq: Once | INTRAVENOUS | Status: DC

## 2021-03-23 NOTE — Progress Notes (Signed)
VS-JD  Pt's states no medical or surgical changes since previsit or office visit.

## 2021-03-23 NOTE — Patient Instructions (Addendum)
No polyps or cancer were seen.  You do have diverticulosis - thickened muscle rings and pouches in the colon wall. Please read the handout about this condition.  I do not recommend a routine repeat colonoscopy in your case as we typically stop after age 66 and the next routine colonoscopy for you would be in 10 years when you will be 65. You can certainly discuss with your primary care provider then and be sent back if appropriate.  I appreciate the opportunity to care for you. Gatha Mayer, MD, [facility]  HANDOUT GIVEN ON DIVERTICULOSIS  YOU HAD AN ENDOSCOPIC PROCEDURE TODAY AT [facility]:   Refer to the procedure report that was given to you for any specific questions about what was found during the examination.  If the procedure report does not answer your questions, please call your gastroenterologist to clarify.  If you requested that your care partner not be given the details of your procedure findings, then the procedure report has been included in a sealed envelope for you to review at your convenience later.  YOU SHOULD EXPECT: Some feelings of bloating in the abdomen. Passage of more gas than usual.  Walking can help get rid of the air that was put into your GI tract during the procedure and reduce the bloating. If you had a lower endoscopy (such as a colonoscopy or flexible sigmoidoscopy) you may notice spotting of blood in your stool or on the toilet paper. If you underwent a bowel prep for your procedure, you may not have a normal bowel movement for a few days.  Please Note:  You might notice some irritation and congestion in your nose or some drainage.  This is from the oxygen used during your procedure.  There is no need for concern and it should clear up in a day or so.  SYMPTOMS TO REPORT IMMEDIATELY:   Following lower endoscopy (colonoscopy or flexible sigmoidoscopy):  Excessive amounts of blood in the stool  Significant tenderness or worsening of abdominal  pains  Swelling of the abdomen that is new, acute  Fever of 100F or higher  For urgent or emergent issues, a gastroenterologist can be reached at any hour by calling [phone]. Do not use MyChart messaging for urgent concerns.    DIET:  We do recommend a small meal at first, but then you may proceed to your regular diet.  Drink plenty of fluids but you should avoid alcoholic beverages for 24 hours.  ACTIVITY:  You should plan to take it easy for the rest of today and you should NOT DRIVE or use heavy machinery until tomorrow (because of the sedation medicines used during the test).    FOLLOW UP: Our staff will call the number listed on your records 48-72 hours following your procedure to check on you and address any questions or concerns that you may have regarding the information given to you following your procedure. If we do not reach you, we will leave a message.  We will attempt to reach you two times.  During this call, we will ask if you have developed any symptoms of COVID 19. If you develop any symptoms (ie: fever, flu-like symptoms, shortness of breath, cough etc.) before then, please call [phone].  If you test positive for Covid 19 in the 2 weeks post procedure, please call and report this information to [place].    If any biopsies were taken you will be contacted by phone or by letter within the  next 1-3 weeks.  Please call us at [phone] if you have not heard about the biopsies in 3 weeks.    SIGNATURES/CONFIDENTIALITY: You and/or your care partner have signed paperwork which will be entered into your electronic medical record.  These signatures attest to the fact that that the information above on your After Visit Summary has been reviewed and is understood.  Full responsibility of the confidentiality of this discharge information lies with you and/or your care-partner.

## 2021-03-23 NOTE — Op Note (Addendum)
[facility] Patient Name: Maria Chase Procedure Date: 03/23/2021 11:11 AM MRN: [id number] Endoscopist: Gatha Mayer , MD Age: 65 Referring MD:  Date of Birth: January 26, 1956 Gender: Female Account #: [id number] Procedure:                Colonoscopy Indications:              Screening for colorectal malignant neoplasm, This                            is the patient's first colonoscopy Medicines:                Propofol per Anesthesia, Monitored Anesthesia Care Procedure:                Pre-Anesthesia Assessment:                           - Prior to the procedure, a History and Physical                            was performed, and patient medications and                            allergies were reviewed. The patient's tolerance of                            previous anesthesia was also reviewed. The risks                            and benefits of the procedure and the sedation                            options and risks were discussed with the patient.                            All questions were answered, and informed consent                            was obtained. Prior Anticoagulants: The patient has                            taken no previous anticoagulant or antiplatelet                            agents. ASA Grade Assessment: III - A patient with                            severe systemic disease. After reviewing the risks                            and benefits, the patient was deemed in                            satisfactory condition to undergo the procedure.  After obtaining informed consent, the colonoscope                            was passed under direct vision. Throughout the                            procedure, the patient's blood pressure, pulse, and                            oxygen saturations were monitored continuously. The                            Olympus PCF-H190DL (#[id number]) Colonoscope was                             introduced through the anus and advanced to the the                            cecum, identified by appendiceal orifice and                            ileocecal valve. The colonoscopy was performed                            without difficulty. The patient tolerated the                            procedure well. The quality of the bowel                            preparation was good. The ileocecal valve,                            appendiceal orifice, and rectum were photographed.                            The bowel preparation used was Miralax via split                            dose instruction. Scope In: 11:31:18 AM Scope Out: 11:44:37 AM Scope Withdrawal Time: 0 hours 9 minutes 9 seconds  Total Procedure Duration: 0 hours 13 minutes 19 seconds  Findings:                 The perianal and digital rectal examinations were                            normal.                           Many small and large-mouthed diverticula were found                            in the sigmoid colon, descending colon, transverse  colon and ascending colon.                           The exam was otherwise without abnormality on                            direct and retroflexion views. Complications:            No immediate complications. Estimated Blood Loss:     Estimated blood loss: none. Impression:               - Severe diverticulosis in the sigmoid colon, in                            the descending colon, in the transverse colon and                            in the ascending colon.                           - The examination was otherwise normal on direct                            and retroflexion views.                           - No specimens collected. Recommendation:           - Patient has a contact number available for                            emergencies. The signs and symptoms of potential                            delayed complications were discussed with  the                            patient. Return to normal activities tomorrow.                            Written discharge instructions were provided to the                            patient.                           - Resume previous diet.                           - Continue present medications.                           - No repeat colonoscopy due to current age (12                            years or older) and the absence of colonic polyps. Gatha Mayer, MD 03/23/2021 11:50:58 AM This report has  been signed electronically.

## 2021-03-23 NOTE — Progress Notes (Signed)
pt tolerated well. VSS. awake and to recovery. Report given to RN.  

## 2021-03-25 ENCOUNTER — Telehealth: Payer: Self-pay

## 2021-03-25 ENCOUNTER — Telehealth: Payer: Self-pay | Admitting: *Deleted

## 2021-03-25 NOTE — Telephone Encounter (Signed)
  Follow up Call-  Call back number 03/23/2021  Post procedure Call Back phone  # [phone]  Permission to leave phone message Yes  Some recent data might be hidden     Patient questions:  Do you have a fever, pain , or abdominal swelling? No. Pain Score  0 *  Have you tolerated food without any problems? Yes.    Have you been able to return to your normal activities? Yes.    Do you have any questions about your discharge instructions: Diet   No. Medications  No. Follow up visit  No.  Do you have questions or concerns about your Care? No.  Actions: * If pain score is 4 or above: No action needed, pain <4.  1. Have you developed a fever since your procedure? no  2.   Have you had an respiratory symptoms (SOB or cough) since your procedure? no  3.   Have you tested positive for COVID 19 since your procedure no  4.   Have you had any family members/close contacts diagnosed with the COVID 19 since your procedure?  no   If yes to any of these questions please route to Joylene John, RN and Joella Prince, RN

## 2021-03-25 NOTE — Telephone Encounter (Signed)
Follow up call attempt, no answer, unable to LM

## 2021-04-14 DIAGNOSIS — J441 Chronic obstructive pulmonary disease with (acute) exacerbation: Secondary | ICD-10-CM | POA: Diagnosis not present

## 2021-05-15 DIAGNOSIS — J441 Chronic obstructive pulmonary disease with (acute) exacerbation: Secondary | ICD-10-CM | POA: Diagnosis not present

## 2021-06-14 DIAGNOSIS — J441 Chronic obstructive pulmonary disease with (acute) exacerbation: Secondary | ICD-10-CM | POA: Diagnosis not present

## 2021-07-05 DEATH — deceased
